# Patient Record
Sex: Female | Born: 1995 | Race: White | Hispanic: No | Marital: Single | Smoking: Never smoker
Health system: Southern US, Community
[De-identification: ages and names within clinical notes are randomized; demographics above are authoritative.]

## PROBLEM LIST (undated history)

## (undated) ENCOUNTER — Emergency Department: Discharge status: Absent without leave | Payer: Medicaid Other

---
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
## 2018-08-11 ENCOUNTER — Other Ambulatory Visit: Payer: Self-pay

## 2018-08-11 ENCOUNTER — Emergency Department (HOSPITAL_COMMUNITY): Payer: Self-pay

## 2018-08-11 ENCOUNTER — Encounter (HOSPITAL_COMMUNITY): Payer: Self-pay | Admitting: Emergency Medicine

## 2018-08-11 ENCOUNTER — Emergency Department (HOSPITAL_COMMUNITY)
Admission: EM | Admit: 2018-08-11 | Discharge: 2018-08-11 | Disposition: A | Discharge status: Home / Self Care | Payer: Self-pay | Attending: Emergency Medicine | Admitting: Emergency Medicine

## 2018-08-11 DIAGNOSIS — N200 Calculus of kidney: Secondary | ICD-10-CM | POA: Insufficient documentation

## 2018-08-11 DIAGNOSIS — M545 Low back pain, unspecified: Secondary | ICD-10-CM

## 2018-08-11 DIAGNOSIS — N3001 Acute cystitis with hematuria: Secondary | ICD-10-CM | POA: Insufficient documentation

## 2018-08-11 DIAGNOSIS — M4306 Spondylolysis, lumbar region: Secondary | ICD-10-CM | POA: Insufficient documentation

## 2018-08-11 LAB — COMPREHENSIVE METABOLIC PANEL
ALT: 15 U/L (ref 0–44)
AST: 16 U/L (ref 15–41)
Albumin: 4.4 g/dL (ref 3.5–5.0)
Alkaline Phosphatase: 56 U/L (ref 38–126)
Anion gap: 14 (ref 5–15)
BUN: 7 mg/dL (ref 6–20)
CO2: 20 mmol/L — ABNORMAL LOW (ref 22–32)
Calcium: 9.4 mg/dL (ref 8.9–10.3)
Chloride: 106 mmol/L (ref 98–111)
Creatinine, Ser: 0.65 mg/dL (ref 0.44–1.00)
GFR calc Af Amer: >60 mL/min (ref >60)
GFR calc non Af Amer: >60 mL/min (ref >60)
Glucose, Bld: 82 mg/dL (ref 70–99)
Potassium: 3.5 mmol/L (ref 3.5–5.1)
Sodium: 140 mmol/L (ref 135–145)
Total Bilirubin: 0.4 mg/dL (ref 0.3–1.2)
Total Protein: 7.9 g/dL (ref 6.5–8.1)

## 2018-08-11 LAB — CBC WITH DIFFERENTIAL/PLATELET
Abs Immature Granulocytes: 0.03 10*3/uL (ref 0.00–0.07)
Basophils Absolute: 0 10*3/uL (ref 0.0–0.1)
Basophils Relative: 0 %
Eosinophils Absolute: 0.1 10*3/uL (ref 0.0–0.5)
Eosinophils Relative: 1 %
HCT: 43.5 % (ref 36.0–46.0)
Hemoglobin: 14.2 g/dL (ref 12.0–15.0)
Immature Granulocytes: 0 %
Lymphocytes Relative: 17 %
Lymphs Abs: 2.1 10*3/uL (ref 0.7–4.0)
MCH: 30.1 pg (ref 26.0–34.0)
MCHC: 32.6 g/dL (ref 30.0–36.0)
MCV: 92.4 fL (ref 80.0–100.0)
Monocytes Absolute: 0.8 10*3/uL (ref 0.1–1.0)
Monocytes Relative: 7 %
Neutro Abs: 9.1 10*3/uL — ABNORMAL HIGH (ref 1.7–7.7)
Neutrophils Relative %: 75 %
Platelets: 343 10*3/uL (ref 150–400)
RBC: 4.71 MIL/uL (ref 3.87–5.11)
RDW: 12.3 % (ref 11.5–15.5)
WBC: 12.1 10*3/uL — ABNORMAL HIGH (ref 4.0–10.5)
nRBC: 0 % (ref 0.0–0.2)

## 2018-08-11 LAB — URINALYSIS, ROUTINE W REFLEX MICROSCOPIC
Bilirubin Urine: NEGATIVE
Glucose, UA: NEGATIVE mg/dL
Ketones, ur: NEGATIVE mg/dL
Nitrite: NEGATIVE
Protein, ur: 100 mg/dL — AB
RBC / HPF: >50 RBC/hpf — ABNORMAL HIGH (ref 0–5)
Specific Gravity, Urine: 1.013 (ref 1.005–1.030)
WBC, UA: >50 WBC/hpf — ABNORMAL HIGH (ref 0–5)
pH: 5 (ref 5.0–8.0)

## 2018-08-11 LAB — POC URINE PREG, ED: Preg Test, Ur: NEGATIVE

## 2018-08-11 LAB — LIPASE, BLOOD: Lipase: 23 U/L (ref 11–51)

## 2018-08-11 MED ORDER — METHOCARBAMOL 500 MG PO TABS: 500.0000 mg | ORAL_TABLET | Freq: Two times a day (BID) | ORAL | 0 refills | Status: AC

## 2018-08-11 MED ORDER — CEPHALEXIN 500 MG PO CAPS
500.0000 mg | ORAL_CAPSULE | Freq: Once | ORAL | Status: AC
  Administered 2018-08-11: 500 mg via ORAL
  Filled 2018-08-11: qty 1

## 2018-08-11 MED ORDER — SODIUM CHLORIDE 0.9 % IV BOLUS
1000.0000 mL | Freq: Once | INTRAVENOUS | Status: AC
  Administered 2018-08-11: 1000 mL via INTRAVENOUS

## 2018-08-11 MED ORDER — CEPHALEXIN 500 MG PO CAPS: 500.0000 mg | ORAL_CAPSULE | Freq: Two times a day (BID) | ORAL | 0 refills | Status: AC

## 2018-08-11 MED ORDER — LIDOCAINE 5 % EX PTCH
1.0000 | MEDICATED_PATCH | CUTANEOUS | Status: DC
  Administered 2018-08-11: 1 via TRANSDERMAL
  Filled 2018-08-11: qty 1

## 2018-08-11 MED ORDER — LIDOCAINE 5 % EX PTCH: 1.0000 | MEDICATED_PATCH | CUTANEOUS | 0 refills | Status: AC

## 2018-08-11 NOTE — ED Provider Notes (Signed)
[facility]-EMERGENCY DEPT Provider Note   CSN: [id number] Arrival date & time: 08/11/18  1452    History   Chief Complaint Chief Complaint  Patient presents with   Back Pain    HPI Kellie Downs is a 23 y.o. female presenting today for back pain x1 week.  Patient reports that she started a new job 2 weeks ago where she is lifting boxes and standing for long periods of time.  Patient reports that over the past 7 days she began having left-sided lower back pain a sharp throbbing sensation constant worsened with movement and palpation and slightly improved with Tylenol.  Patient reports that over the last 2 days her left-sided back pain had subsided however was replaced with a right-sided lower back pain that she also describes as a sharp throb moderate constant worsened with movement and palpation and slightly improved with Tylenol.  Patient denies any fall/injury, fever/chills, IV drug use, history of cancer, saddle area paresthesias, bowel/bladder incontinence, urinary retention, dysuria/hematuria, nausea/vomiting, abdominal pain, vaginal bleeding/discharge or any other concerns today.  She reports that she is an otherwise healthy 23 year old female with no chronic medical conditions or daily medication use.    HPI  History reviewed. No pertinent past medical history.  There are no active problems to display for this patient.   History reviewed. No pertinent surgical history.   OB History   No obstetric history on file.      Home Medications    Prior to Admission medications   Medication Sig Start Date End Date Taking? Authorizing Provider  cephALEXin (KEFLEX) 500 MG capsule Take 1 capsule (500 mg total) by mouth 2 (two) times daily for 14 days. 08/11/18 08/25/18  Harlene SaltsMorelli, Toneisha Savary A, PA-C  lidocaine (LIDODERM) 5 % Place 1 patch onto the skin daily. Remove & Discard patch within 12 hours or as directed by MD 08/11/18   Bill SalinasMorelli, Nicolaas Savo A, PA-C    methocarbamol (ROBAXIN) 500 MG tablet Take 1 tablet (500 mg total) by mouth 2 (two) times daily for 7 days. 08/11/18 08/18/18  Bill SalinasMorelli, Arlena Marsan A, PA-C    Family History No family history on file.  Social History Social History   Tobacco Use   Smoking status: Not on file  Substance Use Topics   Alcohol use: Not on file   Drug use: Not on file     Allergies   Patient has no known allergies.   Review of Systems Review of Systems  Constitutional: Negative.  Negative for chills and fever.  Gastrointestinal: Negative.  Negative for abdominal pain, diarrhea, nausea and vomiting.  Genitourinary: Negative for difficulty urinating, dysuria, frequency, hematuria, pelvic pain, vaginal bleeding and vaginal discharge.  Musculoskeletal: Positive for back pain. Negative for neck pain.  Neurological: Negative.  Negative for dizziness, syncope, weakness, numbness and headaches.  All other systems reviewed and are negative.  Physical Exam Updated Vital Signs BP 120/64    Pulse 74    Temp 98.4 F (36.9 C) (Oral)    Resp 17    SpO2 98%   Physical Exam Constitutional:      General: She is not in acute distress.    Appearance: Normal appearance. She is not ill-appearing or diaphoretic.  HENT:     Head: Normocephalic and atraumatic. No raccoon eyes or Battle's sign.     Jaw: There is normal jaw occlusion. No trismus.     Right Ear: External ear normal.     Left Ear: External ear normal.  Nose: Nose normal.  Eyes:     General: Vision grossly intact. Gaze aligned appropriately.     Extraocular Movements: Extraocular movements intact.     Conjunctiva/sclera: Conjunctivae normal.     Pupils: Pupils are equal, round, and reactive to light.  Neck:     Musculoskeletal: Full passive range of motion without pain, normal range of motion and neck supple. No neck rigidity.     Trachea: Trachea and phonation normal. No tracheal tenderness or tracheal deviation.  Cardiovascular:     Rate and  Rhythm: Normal rate and regular rhythm.     Pulses:          Radial pulses are 2+ on the right side and 2+ on the left side.       Dorsalis pedis pulses are 2+ on the right side and 2+ on the left side.  Pulmonary:     Effort: Pulmonary effort is normal. No respiratory distress.  Abdominal:     General: Bowel sounds are normal. There is no distension.     Palpations: Abdomen is soft. There is no pulsatile mass.     Tenderness: There is no abdominal tenderness. There is right CVA tenderness. There is no left CVA tenderness, guarding or rebound.  Genitourinary:    Comments: Pelvic exam deferred by patient Musculoskeletal:       Back:     Comments: No midline C/T/L spinal tenderness to palpation no deformity, crepitus, or step-off noted. No sign of injury to the neck or back. - Pain to palpation of the right lumbar paraspinal musculature.   Feet:     Right foot:     Protective Sensation: 3 sites tested. 3 sites sensed.     Left foot:     Protective Sensation: 3 sites tested. 3 sites sensed.  Skin:    General: Skin is warm and dry.     Capillary Refill: Capillary refill takes less than 2 seconds.  Neurological:     Mental Status: She is alert.     GCS: GCS eye subscore is 4. GCS verbal subscore is 5. GCS motor subscore is 6.     Comments: Speech is clear and goal oriented, follows commands Major Cranial nerves without deficit, no facial droop Normal strength in upper and lower extremities bilaterally including dorsiflexion and plantar flexion, strong and equal grip strength Sensation normal to light and sharp touch Moves extremities without ataxia, coordination intact Normal gait DTR 2+ bilateral patella, no clonus of the feet  Psychiatric:        Behavior: Behavior is cooperative.    ED Treatments / Results  Labs (all labs ordered are listed, but only abnormal results are displayed) Labs Reviewed  URINALYSIS, ROUTINE W REFLEX MICROSCOPIC - Abnormal; Notable for the following  components:      Result Value   APPearance HAZY (*)    Hgb urine dipstick LARGE (*)    Protein, ur 100 (*)    Leukocytes,Ua MODERATE (*)    RBC / HPF >50 (*)    WBC, UA >50 (*)    Bacteria, UA RARE (*)    Crystals PRESENT (*)    All other components within normal limits  CBC WITH DIFFERENTIAL/PLATELET - Abnormal; Notable for the following components:   WBC 12.1 (*)    Neutro Abs 9.1 (*)    All other components within normal limits  COMPREHENSIVE METABOLIC PANEL - Abnormal; Notable for the following components:   CO2 20 (*)    All other components  within normal limits  LIPASE, BLOOD  POC URINE PREG, ED    EKG None  Radiology Ct Renal Stone Study  Result Date: 08/11/2018 CLINICAL DATA:  23 year old female with left flank pain for 2 days. EXAM: CT ABDOMEN AND PELVIS WITHOUT CONTRAST TECHNIQUE: Multidetector CT imaging of the abdomen and pelvis was performed following the standard protocol without IV contrast. COMPARISON:  None. FINDINGS: Lower chest: Tiny subpleural nodule in the lateral basal segment of the left lower lobe on series 6, image 16 appears to be postinflammatory. Normal heart size. No pericardial or pleural effusion. Hepatobiliary: Negative noncontrast liver and gallbladder. Pancreas: Negative. Spleen: Negative. Adrenals/Urinary Tract: Normal adrenal glands. Punctate left lower pole nephrolithiasis (coronal image 72). No right nephrolithiasis. No hydronephrosis or perinephric stranding. Negative course of both ureters. Diminutive and unremarkable urinary bladder. Stomach/Bowel: Redundant sigmoid is otherwise negative. Negative rectum and descending colon. Redundant transverse colon, otherwise negative. Negative right colon. Normal retrocecal appendix (series 2, image 57). Negative terminal ileum. No dilated small bowel. Negative stomach. No free air, free fluid. Vascular/Lymphatic: Vascular patency is not evaluated in the absence of IV contrast. No lymphadenopathy. Reproductive:  Negative noncontrast appearance. Other: No pelvic free fluid. Musculoskeletal: Chronic left L5 pars fracture. Trace L5-S1 spondylolisthesis. The right L5 pars is intact. 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 No acute osseous abnormality identified. And no lumbar spinal stenosis. IMPRESSION: 1. Punctate left lower pole nephrolithiasis. No obstructive uropathy. 2. Otherwise negative noncontrast CT Abdomen and Pelvis; normal appendix. 3. 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 Associated chronic posterior element degeneration and trace L5-S1 spondylolisthesis. No lumbar spinal stenosis. Electronically Signed   By: Odessa FlemingH  Hall M.D.   On: 08/11/2018 19:18    Procedures Procedures (including critical care time)  Medications Ordered in ED Medications  lidocaine (LIDODERM) 5 % 1 patch (1 patch Transdermal Patch Applied 08/11/18 1918)  sodium chloride 0.9 % bolus 1,000 mL (0 mLs Intravenous Stopped 08/11/18 2035)  cephALEXin (KEFLEX) capsule 500 mg (500 mg Oral Given 08/11/18 2043)     Initial Impression / Assessment and Plan / ED Course  I have reviewed the triage vital signs and the nursing notes.  Pertinent labs & imaging results that were available during my care of the patient were reviewed by me and considered in my medical decision making (see chart for details).    Kellie SearlesMorgan B Mohamed is a 23 y.o. female presenting with 1 week of lower back pain initially left-sided and then transition to the right side, recent started a new job where she was standing for long periods of time and lifting boxes. Patient denies history of trauma, fever, IV drug use, night sweats, weight loss, cancer, saddle anesthesia, urinary rentention, bowel/bladder incontinence. No neurological deficits and normal neuro exam.  She denies any dysuria/hematuria, vaginal bleeding or  discharge.  Pain reproducible to palpation of the right paraspinal lumbar musculature. Abdomen soft/nontender and without pulsatile mass. Patient with equal pedal pulses. Doubt spinal epidural abscess, cauda equina or AAA. - Urine pregnancy test negative Urinalysis with large hemoglobin, 100 protein, moderate leukocytes, greater than 50 red blood cells, greater than 50 white blood cells, concerning for UTI. - I discussed urinalysis results with the patient and she was surprised, she still denies any dysuria or hematuria.  On reassessment she is still with right flank pain concern for possible pyelonephritis, will obtain CT renal study  to evaluate for infected stone as cause of symptoms.  Urine culture sent.  Basic blood work added.  She has again refused pelvic examination or STD testing. - CBC with leukocytosis of 12.1 with left shift CMP nonacute Lipase within normal limits  CT renal stone study:  IMPRESSION:  1. Punctate left lower pole nephrolithiasis. No obstructive  uropathy.  2. Otherwise negative noncontrast CT Abdomen and Pelvis; normal  appendix.  3. Chronic left L5 pars fracture with underlying congenital  incomplete fusion of the L5 posterior elements. Associated chronic  posterior element degeneration and trace L5-S1 spondylolisthesis. No  lumbar spinal stenosis.  -------------------- -------------------- Patient's presentation and history was consistent with musculoskeletal back pain today without red flags however she does have a urinary tract infection today without symptoms.  Will treat patient with Robaxin and Lidoderm patches for musculoskeletal pain secondary to her new job, discussed precautions regarding muscle relaxers with patient and she states understanding. Additionally will start patient on Keflex 500 mg twice daily x14 days for her urinary tract infection, will cover for possible pyelonephritis, difficult to distinguish clinically as she does have musculoskeletal  back pain today however she does appear appropriate for outpatient treatment for UTI/questionable pyelo.  Discussed CT findings with Dr. Lynelle DoctorKnapp including punctate left lower pole kidney stone, this is nonobstructive and doubt infected kidney stone, patient can be treated as outpatient for UTI today.  Additionally patient informed of chronic L5 pars defect and incidental findings as above him to follow-up with PCP regarding this.  At this time there does not appear to be any evidence of an acute emergency medical condition and the patient appears stable for discharge with appropriate outpatient follow up. Diagnosis was discussed with patient who verbalizes understanding of care plan and is agreeable to discharge. I have discussed return precautions with patient who verbalizes understanding of return precautions. Patient encouraged to follow-up with their PCP. All questions answered. Patient has been discharged in good condition.   Note: Portions of this report may have been transcribed using voice recognition software. Every effort was made to ensure accuracy; however, inadvertent computerized transcription errors may still be present. Final Clinical Impressions(s) / ED Diagnoses   Final diagnoses:  Acute cystitis with hematuria  Acute right-sided low back pain without sciatica  Pars defect of lumbar spine  Nephrolithiasis    ED Discharge Orders         Ordered    cephALEXin (KEFLEX) 500 MG capsule  2 times daily     08/11/18 2057    methocarbamol (ROBAXIN) 500 MG tablet  2 times daily     08/11/18 2057    lidocaine (LIDODERM) 5 %  Every 24 hours     08/11/18 2057           Elizabeth PalauMorelli, Tamaiya Bump A, PA-C 08/11/18 2135    Linwood DibblesKnapp, Jon, MD 08/12/18 1316

## 2018-08-11 NOTE — ED Notes (Signed)
Patient transported to CT 

## 2018-08-11 NOTE — Discharge Instructions (Addendum)
You have been diagnosed today with urinary tract infection, right-sided lower back pain without sciatica, pars defect.  At this time there does not appear to be the presence of an emergent medical condition, however there is always the potential for conditions to change. Please read and follow the below instructions.  Please return to the Emergency Department immediately for any new or worsening symptoms or if your symptoms do not improve within 2 days. Please be sure to follow up with your Primary Care Provider within one week regarding your visit today; please call their office to schedule an appointment even if you are feeling better for a follow-up visit. Please take the antibiotic Keflex as prescribed, 500 mg twice daily for 14 days, for treatment of your urinary tract infection.  Please drink plenty of water over the next few days to avoid dehydration. Please call the urologist Dr. Junious Silk on your discharge paperwork tomorrow to discuss finding of your left-sided kidney stone.  This kidney stone is sitting in your kidney and is currently not causing you any symptoms, this is an incidental finding however it may cause symptoms and pain in the future, discussed this with your primary care provider and your urologist. You may use the muscle relaxer Robaxin as prescribed for your musculoskeletal pain.  Do not drive or operate heavy machinery when taking Robaxin as will make you drowsy.  Do not drink alcohol or take other sedating medications while taking Robaxin as this will worsen side effects. You may use the Lidoderm patches as prescribed to help with musculoskeletal pain. Additionally your CT scan today showed chronic left L5 pars fracture with incomplete fusion of the L5 posterior elements.  Additionally it showed trace L5-S1 spondylolisthesis.  Discussed these findings with your primary care provider at your next visit.  Get help right away if: You cannot take your medicine or drink fluids as  told. You have chills and shaking. You throw up. You have very bad pain in your side or back. You feel very weak or you pass out (faint). You develop new bowel or bladder control problems. You have unusual weakness or numbness in your arms or legs. You develop nausea or vomiting. You develop abdominal pain. You feel faint. You have fever/chills Any new/concerning or worsening symptoms.  Please read the additional information packets attached to your discharge summary.  Do not take your medicine if  develop an itchy rash, swelling in your mouth or lips, or difficulty breathing; call 911 and seek immediate emergency medical attention if this occurs.

## 2018-08-11 NOTE — ED Triage Notes (Signed)
Patient c/o lower back pain since starting new job that involves lifting boxes. Denies urinary sx. Ambulatory.

## 2018-12-17 ENCOUNTER — Other Ambulatory Visit: Payer: Self-pay

## 2018-12-17 DIAGNOSIS — Z20822 Contact with and (suspected) exposure to covid-19: Secondary | ICD-10-CM

## 2018-12-18 LAB — NOVEL CORONAVIRUS, NAA: SARS-CoV-2, NAA: NOT DETECTED

## 2019-01-22 ENCOUNTER — Other Ambulatory Visit: Payer: Self-pay

## 2019-01-22 ENCOUNTER — Ambulatory Visit: Payer: Medicaid Other | Attending: Internal Medicine

## 2019-01-22 DIAGNOSIS — Z20822 Contact with and (suspected) exposure to covid-19: Secondary | ICD-10-CM

## 2019-01-24 LAB — NOVEL CORONAVIRUS, NAA: SARS-CoV-2, NAA: NOT DETECTED

## 2020-02-08 ENCOUNTER — Other Ambulatory Visit: Payer: Self-pay

## 2020-02-08 ENCOUNTER — Other Ambulatory Visit (HOSPITAL_COMMUNITY): Payer: Self-pay | Admitting: Emergency Medicine

## 2020-02-08 ENCOUNTER — Emergency Department (HOSPITAL_COMMUNITY)
Admission: EM | Admit: 2020-02-08 | Discharge: 2020-02-08 | Disposition: A | Discharge status: Home / Self Care | Payer: Medicaid Other | Attending: Emergency Medicine | Admitting: Emergency Medicine

## 2020-02-08 ENCOUNTER — Ambulatory Visit (HOSPITAL_COMMUNITY)
Admission: EM | Admit: 2020-02-08 | Discharge: 2020-02-08 | Disposition: A | Discharge status: Home / Self Care | Payer: No Typology Code available for payment source | Attending: Emergency Medicine | Admitting: Emergency Medicine

## 2020-02-08 DIAGNOSIS — Z0441 Encounter for examination and observation following alleged adult rape: Secondary | ICD-10-CM | POA: Insufficient documentation

## 2020-02-08 DIAGNOSIS — R000 Tachycardia, unspecified: Secondary | ICD-10-CM | POA: Insufficient documentation

## 2020-02-08 DIAGNOSIS — T7421XA Adult sexual abuse, confirmed, initial encounter: Secondary | ICD-10-CM

## 2020-02-08 LAB — COMPREHENSIVE METABOLIC PANEL
ALT: 22 U/L (ref 0–44)
AST: 20 U/L (ref 15–41)
Albumin: 4.7 g/dL (ref 3.5–5.0)
Alkaline Phosphatase: 43 U/L (ref 38–126)
Anion gap: 16 — ABNORMAL HIGH (ref 5–15)
BUN: 6 mg/dL (ref 6–20)
CO2: 21 mmol/L — ABNORMAL LOW (ref 22–32)
Calcium: 9.4 mg/dL (ref 8.9–10.3)
Chloride: 109 mmol/L (ref 98–111)
Creatinine, Ser: 0.73 mg/dL (ref 0.44–1.00)
GFR, Estimated: >60 mL/min (ref >60)
Glucose, Bld: 87 mg/dL (ref 70–99)
Potassium: 3.9 mmol/L (ref 3.5–5.1)
Sodium: 146 mmol/L — ABNORMAL HIGH (ref 135–145)
Total Bilirubin: 0.8 mg/dL (ref 0.3–1.2)
Total Protein: 7.6 g/dL (ref 6.5–8.1)

## 2020-02-08 LAB — RAPID HIV SCREEN (HIV 1/2 AB+AG)
HIV 1/2 Antibodies: NONREACTIVE
HIV-1 P24 Antigen - HIV24: NONREACTIVE

## 2020-02-08 LAB — HEPATITIS B SURFACE ANTIGEN: Hepatitis B Surface Ag: NONREACTIVE

## 2020-02-08 LAB — HEPATITIS C ANTIBODY: HCV Ab: NONREACTIVE

## 2020-02-08 LAB — POC URINE PREG, ED: Preg Test, Ur: NEGATIVE

## 2020-02-08 MED ORDER — METRONIDAZOLE 500 MG PO TABS
2000.0000 mg | ORAL_TABLET | Freq: Once | ORAL | Status: AC
  Administered 2020-02-08: 2000 mg via ORAL
  Filled 2020-02-08: qty 4

## 2020-02-08 MED ORDER — CEFTRIAXONE SODIUM 500 MG IJ SOLR
500.0000 mg | Freq: Once | INTRAMUSCULAR | Status: AC
  Administered 2020-02-08: 500 mg via INTRAMUSCULAR
  Filled 2020-02-08: qty 500

## 2020-02-08 MED ORDER — PROMETHAZINE HCL 25 MG PO TABS
25.0000 mg | ORAL_TABLET | Freq: Four times a day (QID) | ORAL | Status: DC | PRN
  Administered 2020-02-08: 25 mg via ORAL

## 2020-02-08 MED ORDER — ELVITEG-COBIC-EMTRICIT-TENOFAF 150-150-200-10 MG PREPACK: 1.0000 | ORAL_TABLET | Freq: Once | ORAL | Status: AC

## 2020-02-08 MED ORDER — ELVITEG-COBIC-EMTRICIT-TENOFAF 150-150-200-10 MG PO TABS: 1.0000 | ORAL_TABLET | Freq: Every day | ORAL | 0 refills | Status: DC

## 2020-02-08 MED ORDER — LIDOCAINE HCL (PF) 1 % IJ SOLN: 1.0000 mL | Freq: Once | INTRAMUSCULAR | Status: DC

## 2020-02-08 MED ORDER — LIDOCAINE HCL (PF) 1 % IJ SOLN
INTRAMUSCULAR | Status: AC
  Filled 2020-02-08: qty 5

## 2020-02-08 MED ORDER — AZITHROMYCIN 250 MG PO TABS
1000.0000 mg | ORAL_TABLET | Freq: Once | ORAL | Status: AC
  Administered 2020-02-08: 1000 mg via ORAL
  Filled 2020-02-08: qty 4

## 2020-02-08 MED ORDER — ELVITEG-COBIC-EMTRICIT-TENOFAF 150-150-200-10 MG PREPACK
ORAL_TABLET | ORAL | Status: AC
  Administered 2020-02-08: 1 via ORAL
  Filled 2020-02-08: qty 1
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
## 2020-02-08 NOTE — SANE Note (Signed)
At approximately 3pm on 02/08/2020 the SANE/FNE Teacher, music) consult was completed. The primary RN and/or provider have been notified.  Please contact the SANE/FNE nurse on call (listed in Amion) with any further concerns.
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
## 2020-02-08 NOTE — Discharge Instructions (Signed)
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 plenty of water may help. Contact your doctor if the problem  does not go away or is severe. This medicine may cause dry eyes and blurred vision. If you wear contact lenses you may feel some discomfort. Lubricating drops may help. See your eye doctor if the problem does not go away or is severe. This medicine can make you more sensitive to the sun. Keep out of the sun. If you cannot avoid being in the sun, wear protective clothing and use sunscreen. Do not use sun lamps or tanning beds/booths. This medicine may increase blood sugar. Ask your health care provider if changes in diet or medicines are needed if you have diabetes. What side effects may I notice from receiving this medicine? Side effects that you should report to your doctor or health care professional as soon as possible:  allergic  reactions like skin rash, itching or hives, swelling of the face, lips, or tongue  breathing problems  changes in vision  confusion  fever, chills, sore throat  pain, redness, or irritation at site where injected  seizures  signs and symptoms of high blood sugar such as being more thirsty or hungry or having to urinate more than normal. You may also feel very tired or have blurry vision.  signs and symptoms of liver injury like dark yellow or brown urine; general ill feeling or flu-like symptoms; light-colored stools; loss of appetite; nausea; right upper belly pain; unusually weak or tired; yellowing of the eyes or skin  signs and symptoms of low blood pressure like dizziness; feeling faint or lightheaded, falls; unusually weak or tired  trouble passing urine or change in the amount of urine  trouble swallowing  uncontrollable movements of the arms, face, head, mouth, neck, or upper body  unusual bruising or bleeding  unusually weak or tired Side effects that usually do not require medical attention (report to your doctor or health care professional if they continue or are bothersome):  drowsiness  dry mouth  restlessness This list may not describe all possible side effects. Call your doctor for medical advice about side effects. You may report side effects to FDA at 1-800-FDA-1088. Where should I keep my medicine? Keep out of the reach of children. Store at room temperature, between 20 and 25 degrees C (68 and 77 degrees F). Protect from light. Throw away any unused medicine after the expiration date. NOTE: This sheet is a summary. It may not cover all possible information. If you have questions about this medicine, talk to your doctor, pharmacist, or health care provider.  2020 Elsevier/Gold Standard (2018-12-02 13:27:34)   Ceftriaxone (Injection) Also known as:  Rocephin  Ceftriaxone Injection What is this medicine? CEFTRIAXONE (sef try AX one) is a cephalosporin  antibiotic. It treats some infections caused by bacteria. It will not work for colds, the flu, or other viruses. This medicine may be used for other purposes; ask your health care provider or pharmacist if you have questions. COMMON BRAND NAME(S): Ceftrisol Plus, Rocephin What should I tell my health care provider before I take this medicine? They need to know if you have any of these conditions:  any chronic illness  bowel disease, like colitis  both kidney and liver disease  high bilirubin level in newborn patients  an unusual or allergic reaction to ceftriaxone, other cephalosporin or penicillin antibiotics, foods, dyes, or preservatives  pregnant or trying to get pregnant  breast-feeding How should I use this medicine? This drug is injected into a muscle or a vein. It is usually given by a health care provider in a  hospital or clinic setting. If you get this drug at home, you will be taught how to prepare and give it. Use exactly as directed. Take it as directed on the prescription label at the same time every day. Keep taking it unless your health care provider tells you to stop. It is important that you put your used needles and syringes in a special sharps container. Do not put them in a trash can. If you do not have a sharps container, call your pharmacist or health care provider to get one. Talk to your health care provider about the use of this drug in children. While it may be prescribed for children as young as newborns for selected conditions, precautions do apply. Overdosage: If you think you have taken too much of this medicine contact a poison control center or emergency room at once. NOTE: This medicine is only for you. Do not share this medicine with others. What if I miss a dose? It is important not to miss your dose. Call your health care provider if you are unable to keep an appointment. If you give yourself this drug at home and you miss a dose, take it as soon as you  can. If it is almost time for your next dose, take only that dose. Do not take double or extra doses. What may interact with this medicine? Do not take this medicine with any of the following medications:  intravenous calcium This medicine may also interact with the following medications:  birth control pills This list may not describe all possible interactions. Give your health care provider a list of all the medicines, herbs, non-prescription drugs, or dietary supplements you use. Also tell them if you smoke, drink alcohol, or use illegal drugs. Some items may interact with your medicine. What should I watch for while using this medicine? Tell your doctor or health care provider if your symptoms do not improve or if they get worse. This medicine may cause serious skin reactions. They can happen weeks to months after starting the medicine. Contact your health care provider right away if you notice fevers or flu-like symptoms with a rash. The rash may be red or purple and then turn into blisters or peeling of the skin. Or, you might notice a red rash with swelling of the face, lips or lymph nodes in your neck or under your arms. Do not treat diarrhea with over the counter products. Contact your doctor if you have diarrhea that lasts more than 2 days or if it is severe and watery. If you are being treated for a sexually transmitted disease, avoid sexual contact until you have finished your treatment. Having sex can infect your sexual partner. Calcium may bind to this medicine and cause lung or kidney problems. Avoid calcium products while taking this medicine and for 48 hours after taking the last dose of this medicine. What side effects may I notice from receiving this medicine? Side effects that you should report to your doctor or health care professional as soon as possible:  allergic reactions like skin rash, itching or hives, swelling of the face, lips, or tongue  breathing problems  fever,  chills  irregular heartbeat  pain when passing urine  redness, blistering, peeling, or loosening of the skin, including inside the mouth  seizures  stomach pain, cramps  unusual bleeding, bruising  unusually weak or tired Side effects that usually do not require medical attention (report to your doctor or health care professional if they continue  or are bothersome):  diarrhea  dizzy, drowsy  headache  nausea, vomiting  pain, swelling, irritation where injected  stomach upset  sweating This list may not describe all possible side effects. Call your doctor for medical advice about side effects. You may report side effects to FDA at 1-800-FDA-1088. Where should I keep my medicine? Keep out of the reach of children and pets. You will be instructed on how to store this drug. Protect from light. Throw away any unused drug after the expiration date. NOTE: This sheet is a summary. It may not cover all possible information. If you have questions about this medicine, talk to your doctor, pharmacist, or health care provider.  2020 Elsevier/Gold Standard (2018-08-29 18:29:21)   Elvitegravir; Cobicistat; Emtricitabine; Tenofovir Alafenamide oral tablets   What is this medicine? ELVITEGRAVIR; COBICISTAT; EMTRICITABINE; TENOFOVIR ALAFENAMIDE (el vye TEG ra veer; koe BIS i stat; em tri SIT uh bean; te NOE fo veer) is 3 antiretroviral medicines and a medication booster in 1 tablet. It is used to treat HIV. This medicine is not a cure for HIV. This medicine can lower, but not fully prevent, the risk of spreading HIV to others. This medicine may be used for other purposes; ask your health care provider or pharmacist if you have questions. COMMON BRAND NAME(S): Genvoya What should I tell my health care provider before I take this medicine? They need to know if you have any of these conditions:  kidney disease  liver disease  an unusual or allergic reaction to elvitegravir,  cobicistat, emtricitabine, tenofovir, other medicines, foods, dyes, or preservatives  pregnant or trying to get pregnant  breast-feeding How should I use this medicine? Take this medicine by mouth with a glass of water. Follow the directions on the prescription label. Take this medicine with food. Take your medicine at regular intervals. Do not take your medicine more often than directed. For your anti-HIV therapy to work as well as possible, take each dose exactly as prescribed. Do not skip doses or stop your medicine even if you feel better. Skipping doses may make the HIV virus resistant to this medicine and other medicines. Do not stop taking except on your doctor's advice. Talk to your pediatrician regarding the use of this medicine in children. While this drug may be prescribed for selected conditions, precautions do apply. Overdosage: If you think you have taken too much of this medicine contact a poison control center or emergency room at once. NOTE: This medicine is only for you. Do not share this medicine with others. What if I miss a dose? If you miss a dose, take it as soon as you can. If it is almost time for your next dose, take only that dose. Do not take double or extra doses. What may interact with this medicine? Do not take this medicine with any of the following medications:  adefovir  alfuzosin  certain medicines for seizures like carbamazepine, phenobarbital, phenytoin  cisapride  lumacaftor; ivacaftor  lurasidone  medicines for cholesterol like lovastatin, simvastatin  medicines for headaches like dihydroergotamine, ergotamine, methylergonovine  midazolam  naloxegol  other antiviral medicines for HIV or AIDS  pimozide  rifampin  sildenafil  St. John's wort  triazolam This medicine may also interact with the following medications:  antacids  atorvastatin  bosentan  buprenorphine; naloxone  certain antibiotics like clarithromycin,  telithromycin, rifabutin, rifapentine  certain medications for anxiety or sleep like buspirone, clorazepate, diazepam, estazolam, flurazepam, zolpidem  certain medicines for blood pressure or heart disease  like amlodipine, diltiazem, felodipine, metoprolol, nicardipine, nifedipine, timolol, verapamil  certain medicines for depression, anxiety, or psychiatric disturbances  certain medicines for erectile dysfunction like avanafil, sildenafil, tadalafil, vardenafil  certain medicines for fungal infection like itraconazole, ketoconazole, voriconazole  certain medicines that treat or prevent blood clots like warfarin, apixaban, betrixaban, dabigatran, edoxaban, and rivaroxaban  colchicine  cyclosporine  female hormones, like estrogens and progestins and birth control pills  medicines for infection like acyclovir, cidofovir, valacyclovir, ganciclovir, valganciclovir  medicines for irregular heart beat like amiodarone, bepridil, digoxin, disopyramide, dofetilide, flecainide, lidocaine, mexiletine, propafenone, quinidine  metformin  oxcarbazepine  phenothiazines like perphenazine, risperidone, thioridazine  salmeterol  sirolimus  steroid medicines like betamethasone, budesonide, ciclesonide, dexamethasone, fluticasone, methylprednisolone, mometasone, triamcinolone  tacrolimus This list may not describe all possible interactions. Give your health care provider a list of all the medicines, herbs, non-prescription drugs, or dietary supplements you use. Also tell them if you smoke, drink alcohol, or use illegal drugs. Some items may interact with your medicine. What should I watch for while using this medicine? Visit your doctor or health care professional for regular check ups. Discuss any new symptoms with your doctor. You will need to have important blood work done while on this medicine. HIV is spread to others through sexual or blood contact. Talk to your doctor about how to stop the  spread of HIV. If you have hepatitis B, talk to your doctor if you plan to stop this medicine. The symptoms of hepatitis B may get worse if you stop this medicine. Birth control pills may not work properly while you are taking this medicine. Talk to your doctor about using an extra method of birth control. Women who can still have children must use a reliable form of barrier contraception, like a condom. What side effects may I notice from receiving this medicine? Side effects that you should report to your doctor or health care professional as soon as possible:  allergic reactions like skin rash, itching or hives, swelling of the face, lips, or tongue  breathing problems  fast, irregular heartbeat  muscle pain or weakness  signs and symptoms of kidney injury like trouble passing urine or change in the amount of urine  signs and symptoms of liver injury like dark yellow or brown urine; general ill feeling or flu-like symptoms; light-colored stools; loss of appetite; right upper belly pain; unusually weak or tired; yellowing of the eyes or skin Side effects that usually do not require medical attention (report to your doctor or health care professional if they continue or are bothersome):  diarrhea  headache  nausea  tiredness This list may not describe all possible side effects. Call your doctor for medical advice about side effects. You may report side effects to FDA at 1-800-FDA-1088. Where should I keep my medicine? Keep out of the reach of children. Store at room temperature below 30 degrees C (86 degrees F). Throw away any unused medicine after the expiration date. NOTE: This sheet is a summary. It may not cover all possible information. If you have questions about this medicine, talk to your doctor, pharmacist, or health care provider.  2020 Elsevier/Gold Standard (2017-06-04 12:15:37)  Results for orders placed or performed during the hospital encounter of 02/08/20   Comprehensive metabolic panel  Result Value Ref Range   Sodium 146 (H) 135 - 145 mmol/L   Potassium 3.9 3.5 - 5.1 mmol/L   Chloride 109 98 - 111 mmol/L   CO2 21 (L) 22 - 32 mmol/L  Glucose, Bld 87 70 - 99 mg/dL   BUN 6 6 - 20 mg/dL   Creatinine, Ser 0.73 0.44 - 1.00 mg/dL   Calcium 9.4 8.9 - 10.3 mg/dL   Total Protein 7.6 6.5 - 8.1 g/dL   Albumin 4.7 3.5 - 5.0 g/dL   AST 20 15 - 41 U/L   ALT 22 0 - 44 U/L   Alkaline Phosphatase 43 38 - 126 U/L   Total Bilirubin 0.8 0.3 - 1.2 mg/dL   GFR, Estimated >60 >60 mL/min   Anion gap 16 (H) 5 - 15  POC urine preg, ED  Result Value Ref Range   Preg Test, Ur NEGATIVE NEGATIVE   Vitals with BMI 02/08/2020 02/08/2020 02/08/2020  Height - '5\' 6"'  -  Weight - 150 lbs -  BMI - 55.97 -  Systolic 416 - 384  Diastolic 81 - 79  Pulse 536 - 119
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
## 2020-02-08 NOTE — ED Notes (Signed)
Pt is with SANE nurse

## 2020-02-08 NOTE — ED Notes (Signed)
Patient evaluated by MD and awaiting SANE consult

## 2020-02-09 LAB — RPR: RPR Ser Ql: NONREACTIVE

## 2020-02-09 MED FILL — GENVOYA TABLET: 150-150-200 | 30 days supply | Qty: 30 | Fill #0

## 2020-02-15 NOTE — SANE Note (Signed)
  At approximately 7:35pm on 02/08/2020, the following co-pay voucher numbers were obtained from the [facility] for Kellie Downs, and emailed to the [facility]:  BIN #:  [id number] PCN:  [id number] GROUP:  [id number] MEMBER #:  [id number]  The pt requested that the medication be mailed to her at her/her mother's residence:  [address] [place], [place] [address]

## 2020-02-18 ENCOUNTER — Other Ambulatory Visit: Payer: Self-pay

## 2020-02-18 ENCOUNTER — Encounter (HOSPITAL_COMMUNITY): Payer: Self-pay

## 2020-02-18 ENCOUNTER — Ambulatory Visit (INDEPENDENT_AMBULATORY_CARE_PROVIDER_SITE_OTHER): Payer: No Payment, Other | Admitting: Clinical

## 2020-02-18 DIAGNOSIS — F331 Major depressive disorder, recurrent, moderate: Secondary | ICD-10-CM | POA: Diagnosis not present

## 2020-02-18 DIAGNOSIS — F4310 Post-traumatic stress disorder, unspecified: Secondary | ICD-10-CM
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
## 2020-03-09 DIAGNOSIS — Z3042 Encounter for surveillance of injectable contraceptive: Secondary | ICD-10-CM | POA: Diagnosis not present

## 2020-03-09 DIAGNOSIS — Z3202 Encounter for pregnancy test, result negative: Secondary | ICD-10-CM | POA: Diagnosis not present

## 2020-03-27 IMAGING — CT CT RENAL STONE PROTOCOL
2 of 4 series · 16 of 46 positions shown, 18 images · non-contrast
Comparison: None.

CLINICAL DATA: 23-year-old female with left flank pain for 2 days.

EXAM:
CT ABDOMEN AND PELVIS WITHOUT CONTRAST
TECHNIQUE: Multidetector CT imaging of the abdomen and pelvis was performed
following the standard protocol without IV contrast.

[Series 2: axial st · axial · 0.72mm/px · z∈[-518,-93]mm · 13 of 97 slices shown, 15 images]
[im 6/97  soft-tissue]
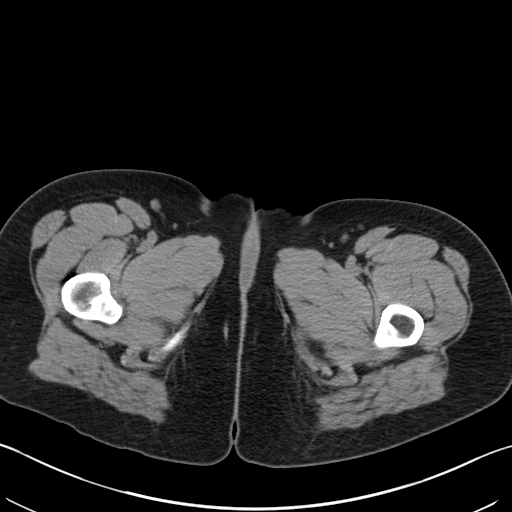
[im 6/97  bone]
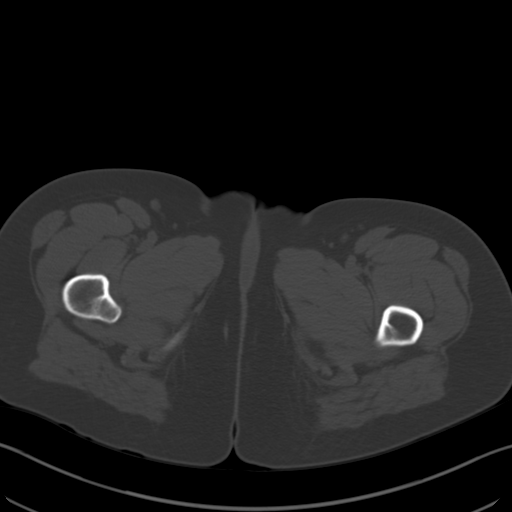
[im 11/97  soft-tissue]
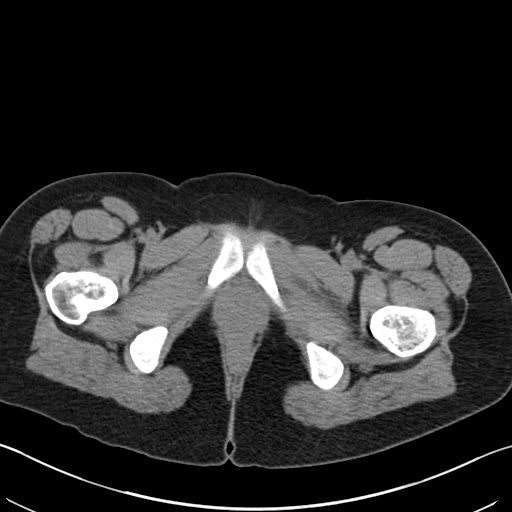
[im 22/97  soft-tissue]
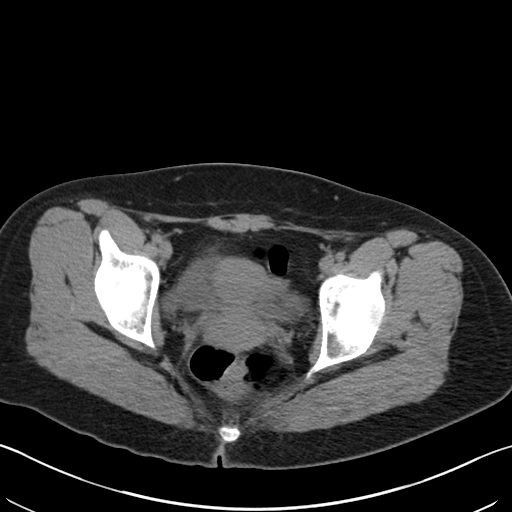
[im 27/97  soft-tissue]
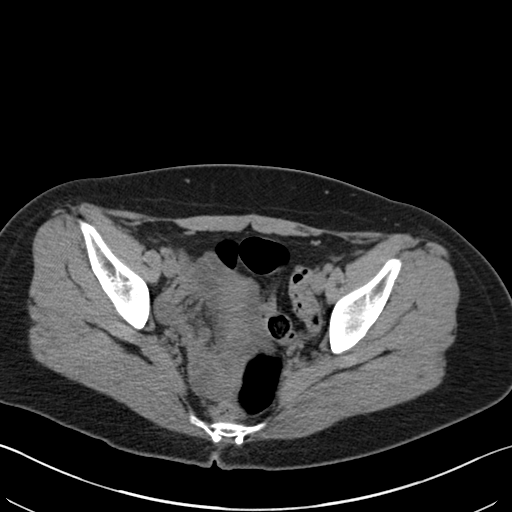
[im 33/97  soft-tissue]
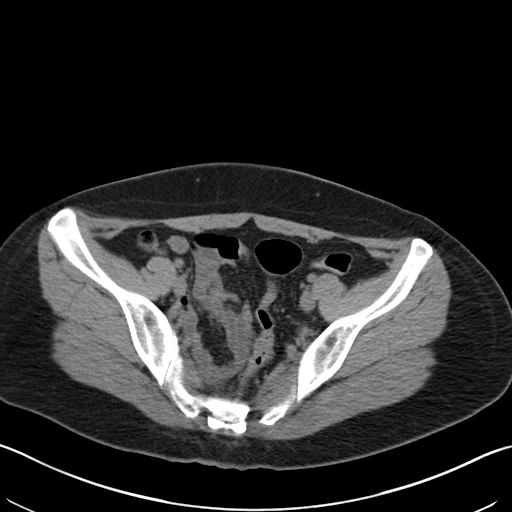
[im 43/97  soft-tissue]
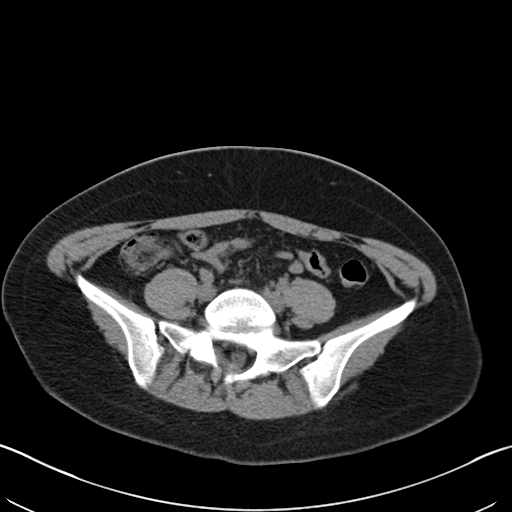
[im 49/97  soft-tissue]
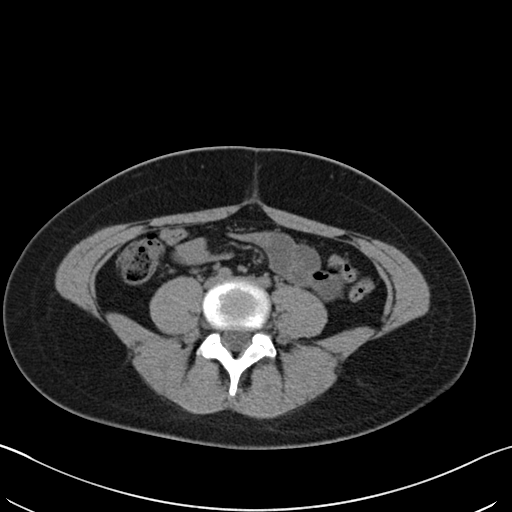
[im 54/97  soft-tissue]
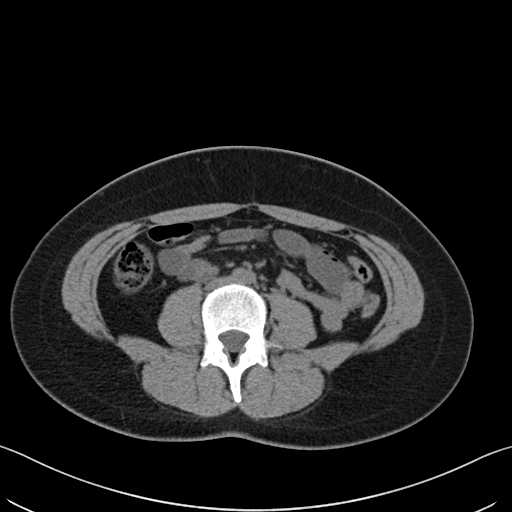
[im 65/97  soft-tissue]
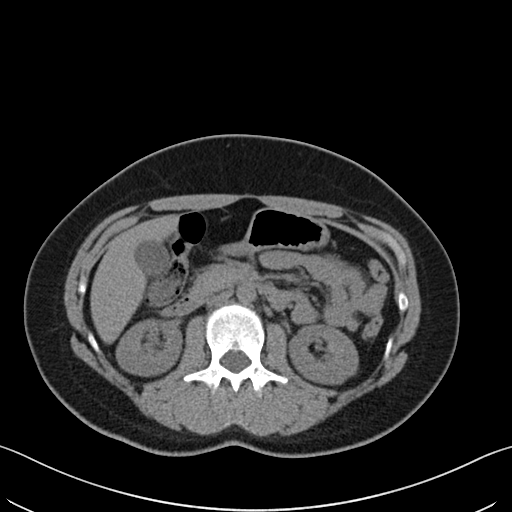
[im 65/97  bone]
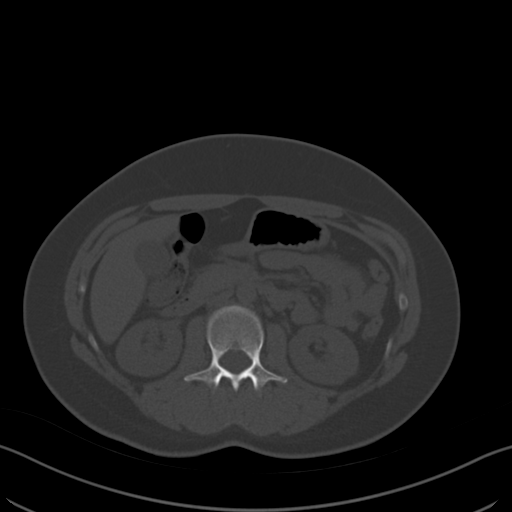
[im 70/97  soft-tissue]
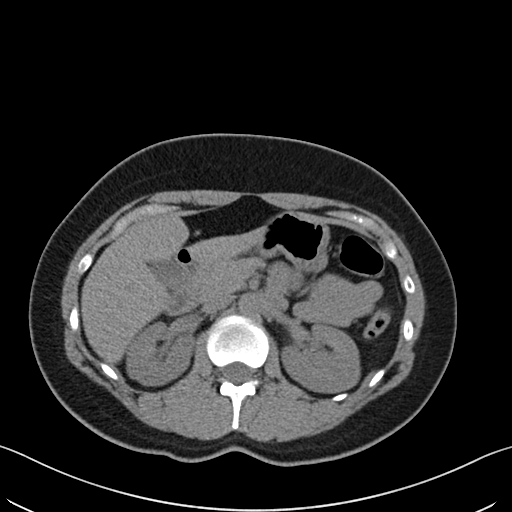
[im 75/97  soft-tissue]
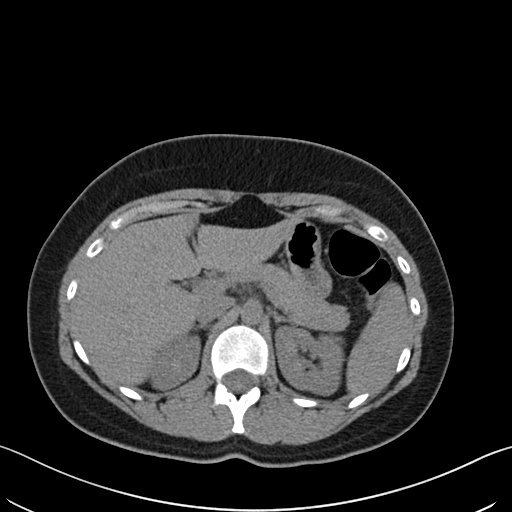
[im 86/97  soft-tissue]
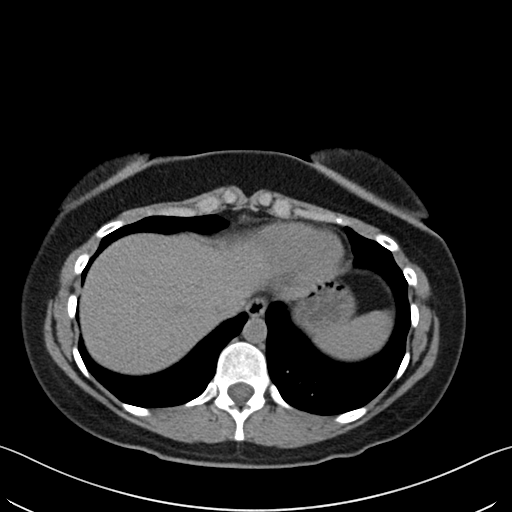
[im 91/97  soft-tissue]
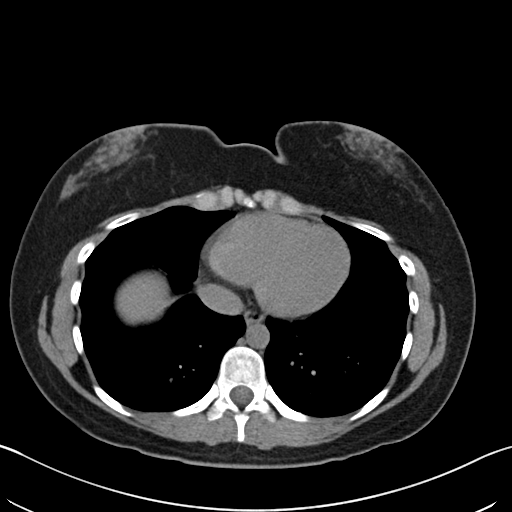

[Series 4: coronal · coronal · 0.90mm/px · 3 of 111 slices shown]
[im 37/111  soft-tissue]
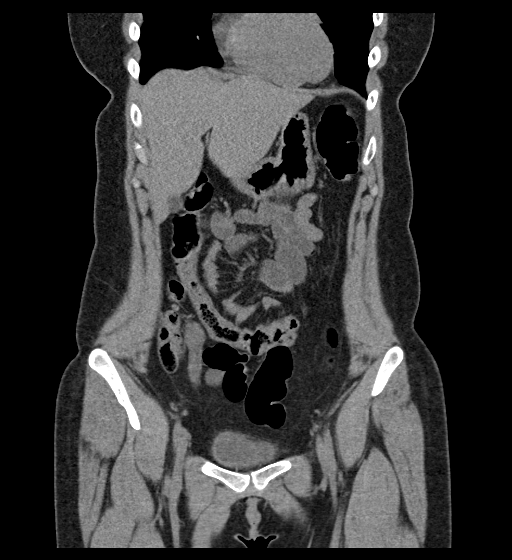
[im 49/111  soft-tissue]
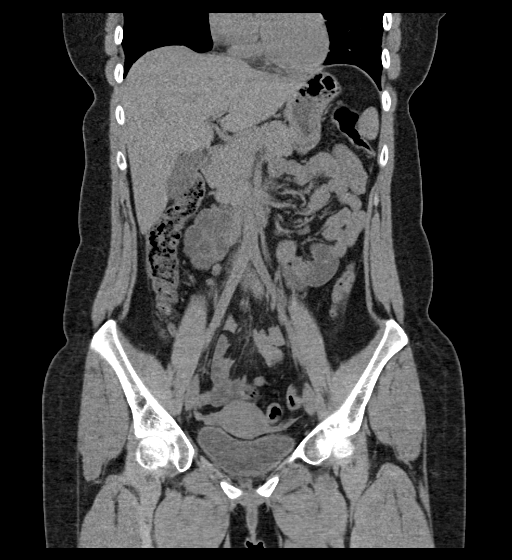
[im 62/111  soft-tissue]
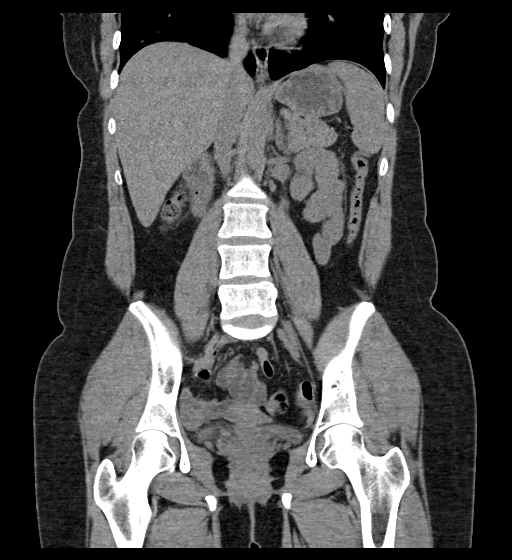

[16 of 46 positions shown; findings below may reference images not displayed]

FINDINGS: Lower chest: Tiny subpleural nodule in the lateral basal segment of
the left lower lobe on series 6, image 16 appears to be
postinflammatory. Normal heart size. No pericardial or pleural
effusion.

Hepatobiliary: Negative noncontrast liver and gallbladder.

Pancreas: Negative.

Spleen: Negative.

Adrenals/Urinary Tract: Normal adrenal glands.

Punctate left lower pole nephrolithiasis (coronal image 72). No
right nephrolithiasis. No hydronephrosis or perinephric stranding.

Negative course of both ureters. Diminutive and unremarkable urinary
bladder.

Stomach/Bowel: Redundant sigmoid is otherwise negative. Negative
rectum and descending colon. Redundant transverse colon, otherwise
negative. Negative right colon. Normal retrocecal appendix (series
2, image 57). Negative terminal ileum.

No dilated small bowel. Negative stomach. No free air, free fluid.

Vascular/Lymphatic: Vascular patency is not evaluated in the absence
of IV contrast.

No lymphadenopathy.

Reproductive: Negative noncontrast appearance.

Other: No pelvic free fluid.

Musculoskeletal: Chronic left L5 pars fracture. Trace L5-S1
spondylolisthesis. The right L5 pars is intact. There is also
congenital spina bifida occulta at L5 where the posterior elements
appear chronically degenerated (series 5, image 83). No acute
osseous abnormality identified. And no lumbar spinal stenosis.
IMPRESSION: 1. Punctate left lower pole nephrolithiasis. No obstructive
uropathy.
2. Otherwise negative noncontrast CT Abdomen and Pelvis; normal
appendix.
3. Chronic left L5 pars fracture with underlying congenital
incomplete fusion of the L5 posterior elements. Associated chronic
posterior element degeneration and trace L5-S1 spondylolisthesis. No
lumbar spinal stenosis.

## 2020-03-29 DIAGNOSIS — Z113 Encounter for screening for infections with a predominantly sexual mode of transmission: Secondary | ICD-10-CM | POA: Diagnosis not present

## 2020-03-29 DIAGNOSIS — Z114 Encounter for screening for human immunodeficiency virus [HIV]: Secondary | ICD-10-CM | POA: Diagnosis not present

## 2020-03-29 DIAGNOSIS — Z124 Encounter for screening for malignant neoplasm of cervix: Secondary | ICD-10-CM | POA: Diagnosis not present

## 2020-04-12 ENCOUNTER — Ambulatory Visit (HOSPITAL_COMMUNITY): Payer: No Payment, Other | Admitting: Clinical

## 2020-04-26 ENCOUNTER — Other Ambulatory Visit (HOSPITAL_BASED_OUTPATIENT_CLINIC_OR_DEPARTMENT_OTHER): Payer: Self-pay

## 2020-04-27 ENCOUNTER — Ambulatory Visit (HOSPITAL_COMMUNITY): Payer: Self-pay | Admitting: Clinical

## 2020-05-31 DIAGNOSIS — Z3042 Encounter for surveillance of injectable contraceptive: Secondary | ICD-10-CM | POA: Diagnosis not present

## 2020-08-23 DIAGNOSIS — Z3042 Encounter for surveillance of injectable contraceptive: Secondary | ICD-10-CM | POA: Diagnosis not present

## 2020-11-22 DIAGNOSIS — Z3042 Encounter for surveillance of injectable contraceptive: Secondary | ICD-10-CM | POA: Diagnosis not present

## 2021-02-14 DIAGNOSIS — Z3042 Encounter for surveillance of injectable contraceptive: Secondary | ICD-10-CM | POA: Diagnosis not present

## 2021-03-10 ENCOUNTER — Other Ambulatory Visit: Payer: Self-pay

## 2021-03-10 ENCOUNTER — Ambulatory Visit
Admission: RE | Admit: 2021-03-10 | Discharge: 2021-03-10 | Disposition: A | Discharge status: Home / Self Care | Payer: No Typology Code available for payment source | Source: Ambulatory Visit

## 2021-03-10 VITALS — BP 124/84 | HR 106 | Temp 98.5°F | Resp 18 | Ht 64.0 in | Wt 145.0 lb

## 2021-03-10 DIAGNOSIS — Z20822 Contact with and (suspected) exposure to covid-19: Secondary | ICD-10-CM | POA: Diagnosis not present

## 2021-03-10 DIAGNOSIS — J029 Acute pharyngitis, unspecified: Secondary | ICD-10-CM

## 2021-03-10 LAB — POCT RAPID STREP A (OFFICE): Rapid Strep A Screen: NEGATIVE

## 2021-03-10 MED ORDER — AMOXICILLIN 500 MG PO CAPS: 500.0000 mg | ORAL_CAPSULE | Freq: Two times a day (BID) | ORAL | 0 refills | Status: AC

## 2021-03-10 NOTE — ED Provider Notes (Signed)
Kellie Downs    CSN: [id number] Arrival date & time: 03/10/21  1650      History   Chief Complaint Chief Complaint  Patient presents with   Appointment   Sore Throat    HPI Kellie Downs is a 26 y.o. female.   Patient presents with sore throat and lymph node swelling that has been present for approximately 4 days.  Denies any associated upper respiratory symptoms or fever.  Denies any known sick contacts. Has not taken any medications over-the-counter.   Sore Throat   History reviewed. No pertinent past medical history.  Patient Active Problem List   Diagnosis Date Noted   Major depressive disorder, recurrent episode, moderate ([place]) 02/18/2020    History reviewed. No pertinent surgical history.  OB History   No obstetric history on file.      Home Medications    Prior to Admission medications   Medication Sig Start Date End Date Taking? Authorizing Provider  amoxicillin (AMOXIL) 500 MG capsule Take 1 capsule (500 mg total) by mouth 2 (two) times daily for 10 days. 03/10/21 03/20/21 Yes Orton Capell, Michele Rockers, FNP  medroxyPROGESTERone Acetate (DEPO-PROVERA IM) Inject into the muscle.   Yes [provider]  lidocaine (LIDODERM) 5 % Place 1 patch onto the skin daily. Remove & Discard patch within 12 hours or as directed by MD 08/11/18   Deliah Boston, PA-C    Family History Family History  Problem Relation Age of Onset   Healthy Mother    Healthy Father    Healthy Brother     Social History Social History   Tobacco Use   Smoking status: Never   Smokeless tobacco: Never  Substance Use Topics   Alcohol use: Never   Drug use: Never     Allergies   Patient has no known allergies.   Review of Systems Review of Systems Per HPI  Physical Exam Triage Vital Signs ED Triage Vitals [03/10/21 1701]  Enc Vitals Group     BP 124/84     Pulse Rate (!) 106     Resp 18     Temp 98.5 F (36.9 C)     Temp Source Oral     SpO2 97 %      Weight 145 lb (65.8 kg)     Height 5\' 4"  (1.626 m)     Head Circumference      Peak Flow      Pain Score 5     Pain Loc      Pain Edu?      Excl. in [place]?    No data found.  Updated Vital Signs BP 124/84 (BP Location: Left Arm)    Pulse (!) 106    Temp 98.5 F (36.9 C) (Oral)    Resp 18    Ht 5\' 4"  (1.626 m)    Wt 145 lb (65.8 kg)    SpO2 97%    BMI 24.89 kg/m   Visual Acuity Right Eye Distance:   Left Eye Distance:   Bilateral Distance:    Right Eye Near:   Left Eye Near:    Bilateral Near:     Physical Exam Constitutional:      General: She is not in acute distress.    Appearance: Normal appearance. She is not toxic-appearing or diaphoretic.  HENT:     Head: Normocephalic and atraumatic.     Right Ear: Tympanic membrane and ear canal normal.     Left Ear: Tympanic  membrane and ear canal normal.     Nose: Nose normal.     Mouth/Throat:     Lips: Pink.     Mouth: Mucous membranes are moist.     Pharynx: Oropharyngeal exudate and posterior oropharyngeal erythema present. No pharyngeal swelling or uvula swelling.     Tonsils: Tonsillar exudate present. No tonsillar abscesses. 1+ on the right. 1+ on the left.  Eyes:     Extraocular Movements: Extraocular movements intact.     Conjunctiva/sclera: Conjunctivae normal.  Cardiovascular:     Rate and Rhythm: Normal rate and regular rhythm.     Pulses: Normal pulses.     Heart sounds: Normal heart sounds.  Pulmonary:     Effort: Pulmonary effort is normal. No respiratory distress.     Breath sounds: Normal breath sounds.  Neurological:     General: No focal deficit present.     Mental Status: She is alert and oriented to person, place, and time. Mental status is at baseline.  Psychiatric:        Mood and Affect: Mood normal.        Behavior: Behavior normal.        Thought Content: Thought content normal.        Judgment: Judgment normal.     UC Treatments / Results  Labs (all labs ordered are listed, but only  abnormal results are displayed) Labs Reviewed  CULTURE, GROUP A STREP ([place])  NOVEL CORONAVIRUS, NAA  POCT RAPID STREP A (OFFICE)    EKG   Radiology No results found.  Procedures Procedures (including critical Downs time)  Medications Ordered in UC Medications - No data to display  Initial Impression / Assessment and Plan / UC Course  I have reviewed the triage vital signs and the nursing notes.  Pertinent labs & imaging results that were available during my Downs of the patient were reviewed by me and considered in my medical decision making (see chart for details).     Rapid strep test was negative but still suspicious of strep throat given appearance of posterior pharynx on exam.  Will opt to treat with amoxicillin antibiotic.  Throat culture and COVID-19 viral swab are pending.  Discussed return precautions.  No test signs of peritonsillar abscess on exam.  Patient verbalized understanding and was agreeable with plan. Final Clinical Impressions(s) / UC Diagnoses   Final diagnoses:  Sore throat     Discharge Instructions      Your rapid strep test was negative but still suspicious of strep throat given appearance of your throat on exam.  You are being treated with antibiotic.  COVID-19 and cultures pending.  We will call if it is positive.    ED Prescriptions     Medication Sig Dispense Auth. Provider   amoxicillin (AMOXIL) 500 MG capsule Take 1 capsule (500 mg total) by mouth 2 (two) times daily for 10 days. 20 capsule Teodora Medici, [place]      PDMP not reviewed this encounter.   Teodora Medici, [place] 03/10/21 [id number]

## 2021-03-10 NOTE — ED Triage Notes (Signed)
Patient c/o sore throat w/white patches and swollen lymph nodes x 4 days.  Afebrile.  Patient is more fatigue, no OTC meds.

## 2021-03-10 NOTE — Discharge Instructions (Addendum)
Your rapid strep test was negative but still suspicious of strep throat given appearance of your throat on exam.  You are being treated with antibiotic.  COVID-19 and cultures pending.  We will call if it is positive.

## 2021-03-11 LAB — SARS-COV-2, NAA 2 DAY TAT

## 2021-03-11 LAB — NOVEL CORONAVIRUS, NAA: SARS-CoV-2, NAA: NOT DETECTED

## 2021-03-13 LAB — CULTURE, GROUP A STREP (THRC)

## 2021-04-25 DIAGNOSIS — Z3042 Encounter for surveillance of injectable contraceptive: Secondary | ICD-10-CM | POA: Diagnosis not present

## 2021-08-01 DIAGNOSIS — Z3042 Encounter for surveillance of injectable contraceptive: Secondary | ICD-10-CM | POA: Diagnosis not present

## 2021-10-05 DIAGNOSIS — Z3042 Encounter for surveillance of injectable contraceptive: Secondary | ICD-10-CM | POA: Diagnosis not present

## 2022-01-06 DIAGNOSIS — Z419 Encounter for procedure for purposes other than remedying health state, unspecified: Secondary | ICD-10-CM | POA: Diagnosis not present

## 2022-01-10 DIAGNOSIS — Z3042 Encounter for surveillance of injectable contraceptive: Secondary | ICD-10-CM | POA: Diagnosis not present

## 2022-02-06 DIAGNOSIS — Z419 Encounter for procedure for purposes other than remedying health state, unspecified: Secondary | ICD-10-CM | POA: Diagnosis not present

## 2022-02-09 ENCOUNTER — Telehealth: Payer: Self-pay

## 2022-02-09 NOTE — Telephone Encounter (Signed)
Mychart msg sent. AS, CMA 

## 2022-03-09 DIAGNOSIS — Z419 Encounter for procedure for purposes other than remedying health state, unspecified: Secondary | ICD-10-CM | POA: Diagnosis not present

## 2022-04-07 DIAGNOSIS — Z419 Encounter for procedure for purposes other than remedying health state, unspecified: Secondary | ICD-10-CM | POA: Diagnosis not present

## 2022-05-01 DIAGNOSIS — Z3042 Encounter for surveillance of injectable contraceptive: Secondary | ICD-10-CM | POA: Diagnosis not present

## 2022-05-01 DIAGNOSIS — Z3202 Encounter for pregnancy test, result negative: Secondary | ICD-10-CM | POA: Diagnosis not present

## 2022-05-08 DIAGNOSIS — Z419 Encounter for procedure for purposes other than remedying health state, unspecified: Secondary | ICD-10-CM | POA: Diagnosis not present

## 2022-07-12 DIAGNOSIS — Z3042 Encounter for surveillance of injectable contraceptive: Secondary | ICD-10-CM | POA: Diagnosis not present

## 2023-05-03 ENCOUNTER — Ambulatory Visit (INDEPENDENT_AMBULATORY_CARE_PROVIDER_SITE_OTHER): Payer: Self-pay

## 2023-05-03 ENCOUNTER — Ambulatory Visit
Admission: RE | Admit: 2023-05-03 | Discharge: 2023-05-03 | Disposition: A | Discharge status: Home / Self Care | Payer: Self-pay | Source: Ambulatory Visit | Attending: Emergency Medicine | Admitting: Emergency Medicine

## 2023-05-03 VITALS — BP 127/84 | HR 79 | Temp 97.8°F | Resp 18

## 2023-05-03 DIAGNOSIS — M79671 Pain in right foot: Secondary | ICD-10-CM
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
## 2023-05-03 NOTE — ED Provider Notes (Signed)
 Renaldo Fiddler    CSN: [id number] Arrival date & time: 05/03/23  1105      History   Chief Complaint Chief Complaint  Patient presents with   Ankle Pain    Entered by patient    HPI Kellie Downs is a 28 y.o. female.  Patient presents with right foot pain x 1 week which started after she rolled her ankle and then fell to the ground while stepping out of a vehicle.  The pain became worse again yesterday.  She has been treating her symptoms with ice packs and elevation.  No OTC medications taken today.  No open wounds, redness, bruising, swelling, numbness, weakness.  No pertinent medical history.  LMP Depo injections.  The history is provided by the patient and medical records.    History reviewed. No pertinent past medical history.  Patient Active Problem List   Diagnosis Date Noted   Major depressive disorder, recurrent episode, moderate ([facility]) 02/18/2020    History reviewed. No pertinent surgical history.  OB History   No obstetric history on file.      Home Medications    Prior to Admission medications   Medication Sig Start Date End Date Taking? Authorizing Provider  lidocaine (LIDODERM) 5 % Place 1 patch onto the skin daily. Remove & Discard patch within 12 hours or as directed by MD Patient not taking: Reported on 05/03/2023 08/11/18   Harlene Salts A, PA-C  medroxyPROGESTERone Acetate (DEPO-PROVERA IM) Inject into the muscle.    [provider]    Family History Family History  Problem Relation Age of Onset   Healthy Mother    Healthy Father    Healthy Brother     Social History Social History   Tobacco Use   Smoking status: Never   Smokeless tobacco: Never  Substance Use Topics   Alcohol use: Never   Drug use: Never     Allergies   Patient has no known allergies.   Review of Systems Review of Systems  Musculoskeletal:  Positive for arthralgias. Negative for gait problem and joint swelling.  Skin:  Negative for color  change, rash and wound.  Neurological:  Negative for weakness and numbness.     Physical Exam Triage Vital Signs ED Triage Vitals [05/03/23 1122]  Encounter Vitals Group     BP 127/84     Systolic BP Percentile      Diastolic BP Percentile      Pulse Rate 79     Resp 18     Temp 97.8 F (36.6 C)     Temp src      SpO2 98 %     Weight      Height      Head Circumference      Peak Flow      Pain Score      Pain Loc      Pain Education      Exclude from Growth Chart    No data found.  Updated Vital Signs BP 127/84   Pulse 79   Temp 97.8 F (36.6 C)   Resp 18   SpO2 98%   Visual Acuity Right Eye Distance:   Left Eye Distance:   Bilateral Distance:    Right Eye Near:   Left Eye Near:    Bilateral Near:     Physical Exam Constitutional:      General: She is not in acute distress. HENT:     Mouth/Throat:  Mouth: Mucous membranes are moist.  Cardiovascular:     Rate and Rhythm: Normal rate and regular rhythm.  Pulmonary:     Effort: Pulmonary effort is normal. No respiratory distress.  Musculoskeletal:        General: Tenderness present. No swelling or deformity. Normal range of motion.       Feet:  Skin:    General: Skin is warm and dry.     Capillary Refill: Capillary refill takes less than 2 seconds.     Findings: No bruising, erythema, lesion or rash.  Neurological:     General: No focal deficit present.     Mental Status: She is alert.     Sensory: No sensory deficit.     Motor: No weakness.     Gait: Gait normal.      UC Treatments / Results  Labs (all labs ordered are listed, but only abnormal results are displayed) Labs Reviewed - No data to display  EKG   Radiology DG Foot Complete Right Result Date: 05/03/2023 CLINICAL DATA:  Persistent right foot and ankle pain after injury a week ago. EXAM: RIGHT FOOT COMPLETE - 3+ VIEW COMPARISON:  None Available. FINDINGS: There is no evidence of fracture or dislocation. There is no evidence  of arthropathy or other focal bone abnormality. Soft tissues are unremarkable. IMPRESSION: Negative. Electronically Signed   By: Obie Dredge M.D.   On: 05/03/2023 12:43    Procedures Procedures (including critical care time)  Medications Ordered in UC Medications - No data to display  Initial Impression / Assessment and Plan / UC Course  I have reviewed the triage vital signs and the nursing notes.  Pertinent labs & imaging results that were available during my care of the patient were reviewed by me and considered in my medical decision making (see chart for details).   Right foot pain. Xray negative.  Treating with postop shoe, rest, elevation, ice packs, ibuprofen.  Education provided on foot pain.  Instructed patient to follow-up with orthopedics if her symptoms are not improving.  Contact information for on-call Ortho provided.  Patient agrees to plan of care.   Final Clinical Impressions(s) / UC Diagnoses   Final diagnoses:  Right foot pain     Discharge Instructions      Take ibuprofen as prescribed.  Rest and elevate your foot.  Apply ice packs 2-3 times a day for up to 20 minutes each.  Wear the postop shoe as directed.    Follow up with an orthopedist such as the one listed below.        ED Prescriptions   None    PDMP not reviewed this encounter.   Mickie Bail, NP 05/03/23 1254

## 2023-05-03 NOTE — Discharge Instructions (Addendum)
 Take ibuprofen as prescribed.  Rest and elevate your foot.  Apply ice packs 2-3 times a day for up to 20 minutes each.  Wear the postop shoe as directed.    Follow up with an orthopedist such as the one listed below.

## 2023-10-15 ENCOUNTER — Ambulatory Visit (HOSPITAL_COMMUNITY): Payer: Self-pay | Admitting: Clinical

## 2023-11-12 ENCOUNTER — Encounter (HOSPITAL_COMMUNITY): Payer: Self-pay

## 2023-11-14 ENCOUNTER — Ambulatory Visit (INDEPENDENT_AMBULATORY_CARE_PROVIDER_SITE_OTHER): Payer: Self-pay | Admitting: Clinical

## 2023-11-14 DIAGNOSIS — F331 Major depressive disorder, recurrent, moderate: Secondary | ICD-10-CM

## 2023-11-18 NOTE — Progress Notes (Signed)
   THERAPIST PROGRESS NOTE Virtual Visit via Video Note  I connected with Kellie Downs on 11/14/2023 at  2:00 PM EDT by a video enabled telemedicine application and verified that I am speaking with the correct person using two identifiers.  Location: Patient: home Provider: office   I discussed the limitations of evaluation and management by telemedicine and the availability of in person appointments. The patient expressed understanding and agreed to proceed.   Follow Up Instructions: I discussed the assessment and treatment plan with the patient. The patient was provided an opportunity to ask questions and all were answered. The patient agreed with the plan and demonstrated an understanding of the instructions.   The patient was advised to call back or seek an in-person evaluation if the symptoms worsen or if the condition fails to improve as anticipated.   Session Time: 30 min  Participation Level: Active  Behavioral Response: CasualAlertEuthymic  Type of Therapy: Individual Therapy  Treatment Goals addressed: client will participate in at least 80% of scheduled individual psychotherapy sessions  ProgressTowards Goals: Progressing  Interventions: CBT and Supportive  Summary:  Kellie Downs is a 28 y.o. female who presents for the scheduled appointment oriented times five, appropriately dressed and friendly. Client denied hallucinations and delusions. Client reported she is doing well. Client reported she has been making a transition. Client reported she has been doing [employer] to supplement her income to offset her debt. Client reported she is still putting in applications. Client reported she is dating a new guy and he has met her parents. Client reported he is nice which is new to her because her past relationships have been toxic. Client reported she is learning to love her herself and know how to accept it from others. Client reported she is happy her parents are  supportive of her. Evidence of progress towards goal:  client reported 1 positive of having positive family support.   Suicidal/Homicidal: Nowithout intent/plan  Therapist Response:  Therapist began the appointment asking the client how she has been doing. Therapist engaged using active listening and positive emotional support. Therapist used cbt to ask her about how she is coping with changes. Therapist used cbt to teach her about boundaries, self acceptance and identifying positives about herself. Therapist used CBT ask the client to identify her progress with frequency of use with coping skills with continued practice in her daily activity.    Therapist assigned her homework to practice self care.   Plan: Return again in 4 weeks.  Diagnosis: mdd, recurrent episode, moderate  Collaboration of Care: Patient refused AEB none requested by the client.  Patient/Guardian was advised Release of Information must be obtained prior to any record release in order to collaborate their care with an outside provider. Patient/Guardian was advised if they have not already done so to contact the registration department to sign all necessary forms in order for us  to release information regarding their care.   Consent: Patient/Guardian gives verbal consent for treatment and assignment of benefits for services provided during this visit. Patient/Guardian expressed understanding and agreed to proceed.   Advaith Lamarque Y Letonya Mangels, LCSW 11/14/2023

## 2023-11-26 ENCOUNTER — Ambulatory Visit (HOSPITAL_COMMUNITY): Payer: Self-pay | Admitting: Clinical

## 2023-11-28 ENCOUNTER — Encounter (HOSPITAL_COMMUNITY): Payer: Self-pay

## 2023-12-04 ENCOUNTER — Ambulatory Visit (INDEPENDENT_AMBULATORY_CARE_PROVIDER_SITE_OTHER): Payer: Self-pay | Admitting: Clinical

## 2023-12-04 DIAGNOSIS — F331 Major depressive disorder, recurrent, moderate: Secondary | ICD-10-CM

## 2023-12-16 NOTE — Progress Notes (Signed)
   THERAPIST PROGRESS NOTE Virtual Visit via Video Note  I connected with Kellie Downs on 12/04/2023 at  3:00 PM EDT by a video enabled telemedicine application and verified that I am speaking with the correct person using two identifiers.  Location: Patient: home Provider: remote office at home   I discussed the limitations of evaluation and management by telemedicine and the availability of in person appointments. The patient expressed understanding and agreed to proceed.   Follow Up Instructions: I discussed the assessment and treatment plan with the patient. The patient was provided an opportunity to ask questions and all were answered. The patient agreed with the plan and demonstrated an understanding of the instructions.   The patient was advised to call back or seek an in-person evaluation if the symptoms worsen or if the condition fails to improve as anticipated.   Session Time: 30 min  Participation Level: Active  Behavioral Response: CasualAlertEuthymic  Type of Therapy: Individual Therapy  Treatment Goals addressed: client will engage in at least 80% of scheduled individual psychotherapy session  ProgressTowards Goals: Progressing  Interventions: CBT and Supportive  Summary:  Kellie Downs is a 28 y.o. female who presents for the scheduled appointment oriented times five, appropriately dressed and friendly. Client denied hallucinations and delusions. Client reported she is doing pretty good. Client reported she and her boyfriend have broken up. Client reported it was shock how it happened because he exited it to her. Client reported she feels like he took a immature approach to saying maybe he was not ready for a relationship. Client reported she is going to take this time now to focus on herself. Client reported she has done some applications and got a call back for [employer]. Client reported although it is not the best pay she is grateful to make money. Client  reported she goes back soon to do paperwork. Evidence of progress towards goal:  client reported 1 positive of identifying her boundaries and why they are important for her self esteem.   Suicidal/Homicidal: Nowithout intent/plan  Therapist Response:  Therapist began the appointment asking the client how she has been doing. Therapist engaged using active listening and positive emotional support. Therapist used cbt to give her time to express her thoughts and feelings related to how she has managing changes and stressors. Therapist used cbt to engage and continue teaching her about asserting boundaries. Therapist used CBT ask the client to identify her progress with frequency of use with coping skills with continued practice in her daily activity.    Therapist assigned homework to practice self care.   Plan: Return again in 4 weeks.  Diagnosis: mdd, recurrent episode, moderate  Collaboration of Care: Patient refused AEB none requested by the client.  Patient/Guardian was advised Release of Information must be obtained prior to any record release in order to collaborate their care with an outside provider. Patient/Guardian was advised if they have not already done so to contact the registration department to sign all necessary forms in order for us  to release information regarding their care.   Consent: Patient/Guardian gives verbal consent for treatment and assignment of benefits for services provided during this visit. Patient/Guardian expressed understanding and agreed to proceed.   Tavone Caesar Y Derron Pipkins, LCSW 12/04/2023

## 2023-12-26 ENCOUNTER — Ambulatory Visit (HOSPITAL_COMMUNITY): Payer: Self-pay | Admitting: Clinical

## 2023-12-26 ENCOUNTER — Encounter (HOSPITAL_COMMUNITY): Payer: Self-pay
